# Patient Record
Sex: Female | Born: 1970 | Race: Asian | Hispanic: No | Marital: Married | Smoking: Former smoker
Health system: Southern US, Community
[De-identification: ages and names within clinical notes are randomized; demographics above are authoritative.]

## PROBLEM LIST (undated history)

## (undated) DIAGNOSIS — F418 Other specified anxiety disorders: Secondary | ICD-10-CM

## (undated) HISTORY — PX: CARPAL TUNNEL RELEASE: SHX101

---

## 1999-12-12 ENCOUNTER — Inpatient Hospital Stay (HOSPITAL_COMMUNITY): Admission: EM | Admit: 1999-12-12 | Discharge: 1999-12-16 | Payer: Self-pay | Admitting: *Deleted

## 2012-10-15 ENCOUNTER — Emergency Department
Admission: EM | Admit: 2012-10-15 | Discharge: 2012-10-15 | Disposition: A | Discharge status: Home / Self Care | Payer: BC Managed Care – PPO | Source: Home / Self Care | Attending: Family Medicine | Admitting: Family Medicine

## 2012-10-15 ENCOUNTER — Encounter: Payer: Self-pay | Admitting: *Deleted

## 2012-10-15 DIAGNOSIS — J020 Streptococcal pharyngitis: Secondary | ICD-10-CM

## 2012-10-15 DIAGNOSIS — H6011 Cellulitis of right external ear: Secondary | ICD-10-CM

## 2012-10-15 DIAGNOSIS — H60399 Other infective otitis externa, unspecified ear: Secondary | ICD-10-CM

## 2012-10-15 HISTORY — DX: Other specified anxiety disorders: F41.8

## 2012-10-15 MED ORDER — PENICILLIN V POTASSIUM 500 MG PO TABS: ORAL_TABLET | ORAL | Status: DC

## 2012-10-15 NOTE — ED Provider Notes (Signed)
History     CSN: [id number]  Arrival date & time 10/15/12  0827   First MD Initiated Contact with Patient 10/15/12 [phone]      Chief Complaint  Patient presents with  . Sore Throat  . Fever      HPI Comments: Patient presents with two problems: 1)  She developed a sore throat, fever, fatigue, myalgias, and chills/sweats yesterday. 2)  She developed pain, swelling, and purulent drainage from a piercing in her right earlobe 3 days ago.  The history is provided by the patient.    Past Medical History  Diagnosis Date  . Depression with anxiety     Past Surgical History  Procedure Laterality Date  . Carpal tunnel release      Family History  Problem Relation Age of Onset  . Diabetes Father     History  Substance Use Topics  . Smoking status: Former Smoker    Types: Cigarettes  . Smokeless tobacco: Never Used  . Alcohol Use: Yes    OB History   Grav Para Term Preterm Abortions TAB SAB Ect Mult Living                  Review of Systems + sore throat No cough No pleuritic pain No wheezing No nasal congestion ? post-nasal drainage No sinus pain/pressure No itchy/red eyes No earache, but has pain/swelling right earlobe No hemoptysis No SOB No fever, + chills No nausea No vomiting No abdominal pain No diarrhea No urinary symptoms No skin rashes + fatigue + myalgias No headache    Allergies  Review of patient's allergies indicates no known allergies.  Home Medications   Current Outpatient Rx  Name  Route  Sig  Dispense  Refill  . Multiple Vitamin (MULTIVITAMIN) tablet   Oral   Take 1 tablet by mouth daily.         Marland Kitchen venlafaxine XR (EFFEXOR-XR) 150 MG 24 hr capsule   Oral   Take 150 mg by mouth daily.         . vitamin B-12 (CYANOCOBALAMIN) 100 MCG tablet   Oral   Take 50 mcg by mouth daily.         . penicillin v potassium (VEETID) 500 MG tablet      Take one tab by mouth twice daily for 10 days   20 tablet   0     BP 128/89   Pulse 100  Temp(Src) 101.2 F (38.4 C) (Oral)  Resp 14  Ht 5\' 8"  (1.727 m)  Wt 138 lb (62.596 kg)  BMI 20.99 kg/m2  SpO2 99%  LMP 10/12/2012  Physical Exam Nursing notes and Vital Signs reviewed. Appearance:  Patient appears healthy, stated age, and in no acute distress Eyes:  Pupils are equal, round, and reactive to light and accomodation.  Extraocular movement is intact.  Conjunctivae are not inflamed  Ears:  Canals normal.  Tympanic membranes normal.  Right earlobe is erythematous/tender at site of piercing with crusted discharge present. Nose:  Mildly congested turbinates.  No sinus tenderness.   Pharynx:  Erythematous and slightly swollen without obstruction.  No exudate Neck:  Supple.   Tender enlarged anterior nodes are palpated bilaterally  Lungs:  Clear to auscultation.  Breath sounds are equal.  Heart:  Regular rate and rhythm without murmurs, rubs, or gallops.  Abdomen:  Nontender without masses or hepatosplenomegaly.  Bowel sounds are present.  No CVA or flank tenderness.  Extremities:  No edema.  No calf tenderness  Skin:  No rash present.   ED Course  Procedures  none  Labs Reviewed  POCT RAPID STREP A (OFFICE) - Abnormal; Notable for the following:    Rapid Strep A Screen Positive (*)       WOUND CULTURE pending      1. Streptococcal sore throat   2. Cellulitis of right external ear       MDM  Wound culture taken right ear lobe. Begin pen VK. May take Ibuprofen 200mg , 4 tabs every 8 hours with food.  Try warm salt water gargles.  Apply warm compress to right ear lobe several times daily. Followup with Family Doctor if not improved in one week.         Lattie Haw, MD 10/15/12 [phone]

## 2012-10-15 NOTE — ED Notes (Signed)
Kayla James c/o sore throat and fever and body aches x yesterday.

## 2012-10-17 ENCOUNTER — Telehealth: Payer: Self-pay

## 2012-10-17 LAB — WOUND CULTURE: Gram Stain: NONE SEEN

## 2012-10-17 NOTE — ED Notes (Signed)
Left a message on voice mail asking how patient is feeling and advising to call back with any questions or concerns.  

## 2015-02-24 ENCOUNTER — Emergency Department (INDEPENDENT_AMBULATORY_CARE_PROVIDER_SITE_OTHER): Payer: 59

## 2015-02-24 ENCOUNTER — Encounter: Payer: Self-pay | Admitting: *Deleted

## 2015-02-24 ENCOUNTER — Emergency Department
Admission: EM | Admit: 2015-02-24 | Discharge: 2015-02-24 | Disposition: A | Discharge status: Home / Self Care | Payer: 59 | Source: Home / Self Care | Attending: Family Medicine | Admitting: Family Medicine

## 2015-02-24 DIAGNOSIS — S338XXA Sprain of other parts of lumbar spine and pelvis, initial encounter: Secondary | ICD-10-CM | POA: Diagnosis not present

## 2015-02-24 DIAGNOSIS — M533 Sacrococcygeal disorders, not elsewhere classified: Secondary | ICD-10-CM | POA: Diagnosis not present

## 2015-02-24 MED ORDER — MELOXICAM 15 MG PO TABS: 15.0000 mg | ORAL_TABLET | Freq: Every day | ORAL | Status: AC

## 2015-02-24 MED ORDER — CYCLOBENZAPRINE HCL 10 MG PO TABS: ORAL_TABLET | ORAL | Status: AC

## 2015-02-24 NOTE — ED Provider Notes (Signed)
CSN: [id number]     Arrival date & time 02/24/15  4098 History   First MD Initiated Contact with Patient 02/24/15 1044     Chief Complaint  Patient presents with  . Tailbone Pain      HPI Comments: While in her bathroom last night, patient lost her balance and fell backwards, striking her coccyx on a windowsill.  She has had persistent pain, worse when arising from a chair and walking.  Patient is a 44 y.o. female presenting with back pain. The history is provided by the patient.  Back Pain Pain location: coccyx. Quality:  Aching Radiates to:  Does not radiate Pain severity:  Moderate Pain is:  Same all the time Onset quality:  Sudden Duration:  1 day Timing:  Constant Progression:  Unchanged Chronicity:  New Context: falling   Relieved by:  Nothing Worsened by:  Movement Ineffective treatments:  Cold packs and heating pad Associated symptoms: no abdominal pain, no bladder incontinence, no bowel incontinence, no leg pain, no numbness, no paresthesias, no pelvic pain, no perianal numbness and no tingling     Past Medical History  Diagnosis Date  . Depression with anxiety    Past Surgical History  Procedure Laterality Date  . Carpal tunnel release     Family History  Problem Relation Age of Onset  . Diabetes Father    Social History  Substance Use Topics  . Smoking status: Former Smoker    Types: Cigarettes  . Smokeless tobacco: Never Used  . Alcohol Use: Yes   OB History    No data available     Review of Systems  Gastrointestinal: Negative for abdominal pain and bowel incontinence.  Genitourinary: Negative for bladder incontinence and pelvic pain.  Musculoskeletal: Positive for back pain.  Neurological: Negative for tingling, numbness and paresthesias.  All other systems reviewed and are negative.   Allergies  Review of patient's allergies indicates no known allergies.  Home Medications   Prior to Admission medications   Medication Sig Start Date End  Date Taking? Authorizing Provider  cyclobenzaprine (FLEXERIL) 10 MG tablet Take one tab by mouth at bedtime for muscle spasm 02/24/15   Lattie Haw, MD  meloxicam (MOBIC) 15 MG tablet Take 1 tablet (15 mg total) by mouth daily. Take with food each morning 02/24/15   Lattie Haw, MD  Multiple Vitamin (MULTIVITAMIN) tablet Take 1 tablet by mouth daily.    Historical Provider, MD  venlafaxine XR (EFFEXOR-XR) 150 MG 24 hr capsule Take 150 mg by mouth daily.    Historical Provider, MD  vitamin B-12 (CYANOCOBALAMIN) 100 MCG tablet Take 50 mcg by mouth daily.    Historical Provider, MD   Meds Ordered and Administered this Visit  Medications - No data to display  BP 121/76 mmHg  Pulse 70  Temp(Src) 98.6 F (37 C) (Oral)  Ht  (1.727 m)  Wt 131 lb 8 oz (59.648 kg)  BMI 20.00 kg/m2  SpO2 100%  LMP 02/22/2015 No data found.   Physical Exam  Constitutional: She is oriented to person, place, and time. She appears well-developed and well-nourished. No distress.  HENT:  Head: Atraumatic.  Mouth/Throat: Oropharynx is clear and moist.  Eyes: Conjunctivae are normal. Pupils are equal, round, and reactive to light.  Neck: Normal range of motion.  Cardiovascular: Normal heart sounds.   Pulmonary/Chest: Breath sounds normal.  Abdominal: There is no tenderness.  Musculoskeletal:       Back:  There is distinct point tenderness  to palpation over the superior aspect of coccyx as noted on diagram. Back has good range of motion.  Can heel/toe walk and squat without difficulty.  Straight leg raising test is negative.  Sitting knee extension test is negative.  Strength and sensation in the lower extremities is normal.  Patellar and achilles reflexes are normal     Neurological: She is alert and oriented to person, place, and time.  Skin: Skin is warm and dry.  Nursing note and vitals reviewed.   ED Course  Procedures  None   Imaging Review Dg Sacrum/coccyx  02/24/2015   CLINICAL DATA:   Fall.  Coccygeal pain  EXAM: SACRUM AND COCCYX - 2+ VIEW  COMPARISON:  None.  FINDINGS: There is no evidence of fracture or other focal bone lesions.  IMPRESSION: Negative.   Electronically Signed   By: Marlan Palau M.D.   On: 02/24/2015 10:42     MDM   1. Coccyx sprain, initial encounter    Rx for Mobic  QAM, and Flexeril  HS Apply ice pack for 20 to 30 minutes, 3 to 4 times daily  Continue until pain decreases.  Obtain a "doughnut" pad for sitting. Followup with Dr. Rodney Langton or Dr. Clementeen [facility] ([facility]) if not improving about two weeks.     Lattie Haw, MD 02/25/15 [phone]

## 2015-02-24 NOTE — ED Notes (Signed)
Pt was startled by her child last night while in the bathroom and she fell onto the windowsill righ ton her tailbone.  Pain is 8/10 and worse when moving.  She has trouble rising from a sitting position.  Has tried ice and heat with little relief.

## 2015-02-24 NOTE — Discharge Instructions (Signed)
Apply ice pack for 20 to 30 minutes, 3 to 4 times daily  Continue until pain decreases.  Obtain a "doughnut" pad for sitting.   Tailbone Injury The tailbone (coccyx) is the small bone at the lower end of the spine. A tailbone injury may involve stretched ligaments, bruising, or a broken bone (fracture). Women are more vulnerable to this injury due to having a wider pelvis. CAUSES  This type of injury typically occurs from falling and landing on the tailbone. Repeated strain or friction from actions such as rowing and bicycling may also injure the area. The tailbone can be injured during childbirth. Infections or tumors may also press on the tailbone and cause pain. Sometimes, the cause of injury is unknown. SYMPTOMS   Bruising.  Pain when sitting.  Painful bowel movements.  In women, pain during intercourse. DIAGNOSIS  Your caregiver can diagnose a tailbone injury based on your symptoms and a physical exam. X-rays may be taken if a fracture is suspected. Your caregiver may also use an MRI scan imaging test to evaluate your symptoms. TREATMENT  Your caregiver may prescribe medicines to help relieve your pain. Most tailbone injuries heal on their own in 4 to 6 weeks. However, if the injury is caused by an infection or tumor, the recovery period may vary. PREVENTION  Wear appropriate padding and sports gear when bicycling and rowing. This can help prevent an injury from repeated strain or friction. HOME CARE INSTRUCTIONS   Put ice on the injured area.  Put ice in a plastic bag.  Place a towel between your skin and the bag.  Leave the ice on for 15-20 minutes, every hour while awake for the first 1 to 2 days.  Sit on a large, rubber or inflated ring or cushion to ease your pain. Lean forward when sitting to help decrease discomfort.  Avoid sitting for long periods of time.  Increase your activity as the pain allows.  Only take over-the-counter or prescription medicines for pain,  discomfort, or fever as directed by your caregiver.  You may use stool softeners if it is painful to have a bowel movement, or as directed by your caregiver.  Eat a diet with plenty of fiber to help prevent constipation.  Keep all follow-up appointments as directed by your caregiver. SEEK MEDICAL CARE IF:   Your pain becomes worse.  Your bowel movements cause a great deal of discomfort.  You are unable to have a bowel movement.  You have a fever. MAKE SURE YOU:  Understand these instructions.  Will watch your condition.  Will get help right away if you are not doing well or get worse. Document Released: 05/09/2000 Document Revised: 08/04/2011 Document Reviewed: 12/05/2010 [facility] Patient Information 2015 [place], [place]. This information is not intended to replace advice given to you by your health care provider. Make sure you discuss any questions you have with your health care provider.

## 2016-12-02 IMAGING — CR DG SACRUM/COCCYX 2+V
3 series · 3 of 3 positions shown · non-contrast
Comparison: None.

CLINICAL DATA: Fall.  Coccygeal pain

EXAM:
SACRUM AND COCCYX - 2+ VIEW

[coccyx ap]
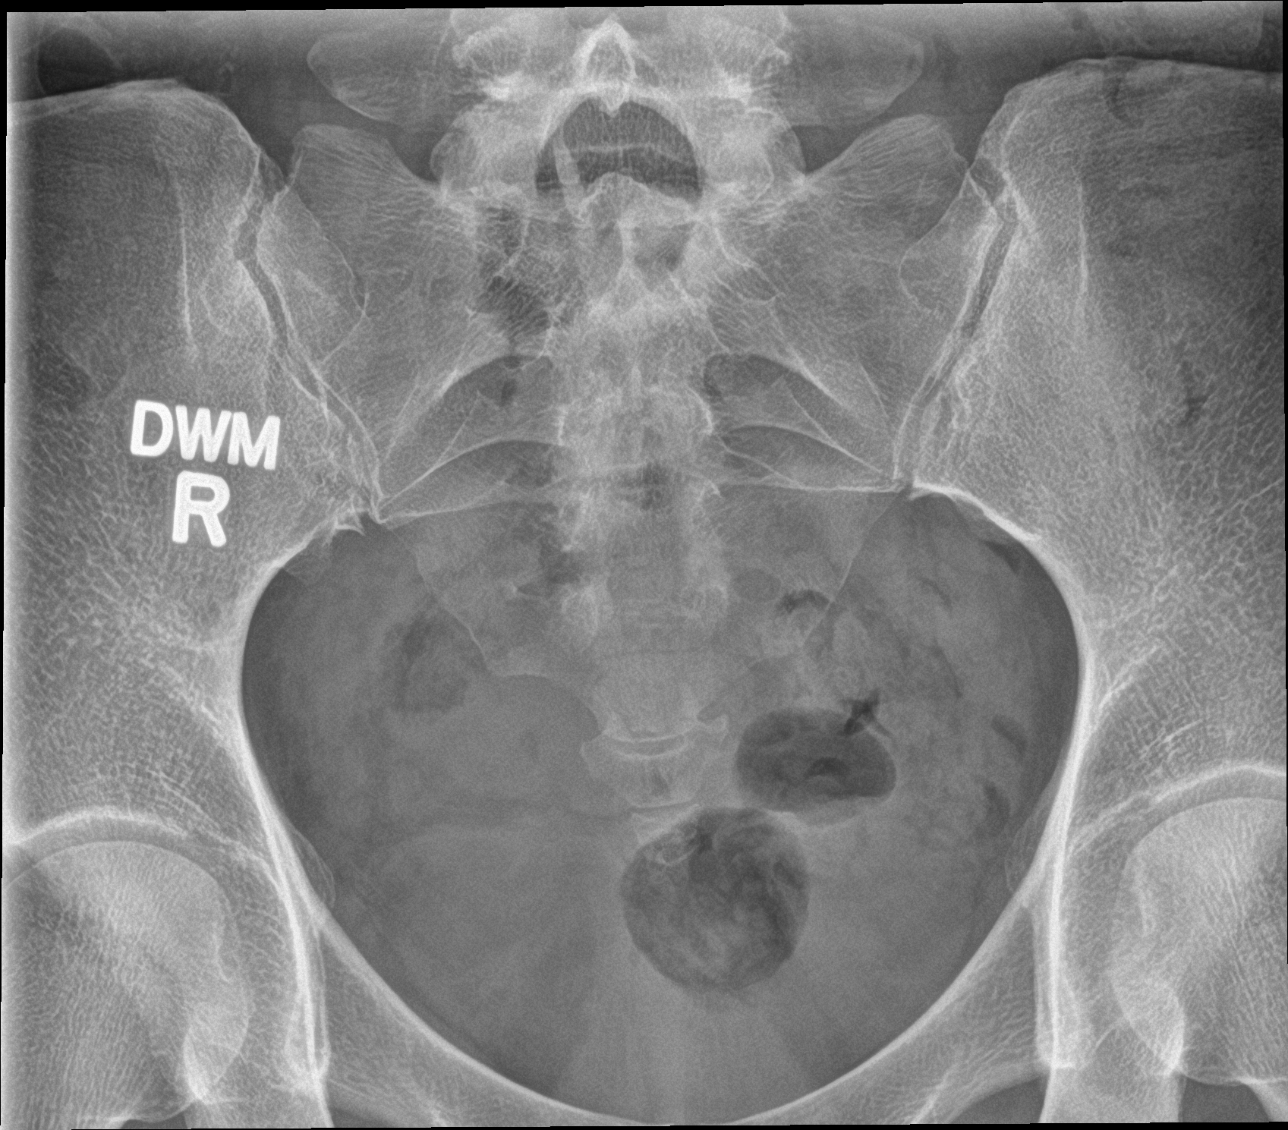

[sacrum ap]
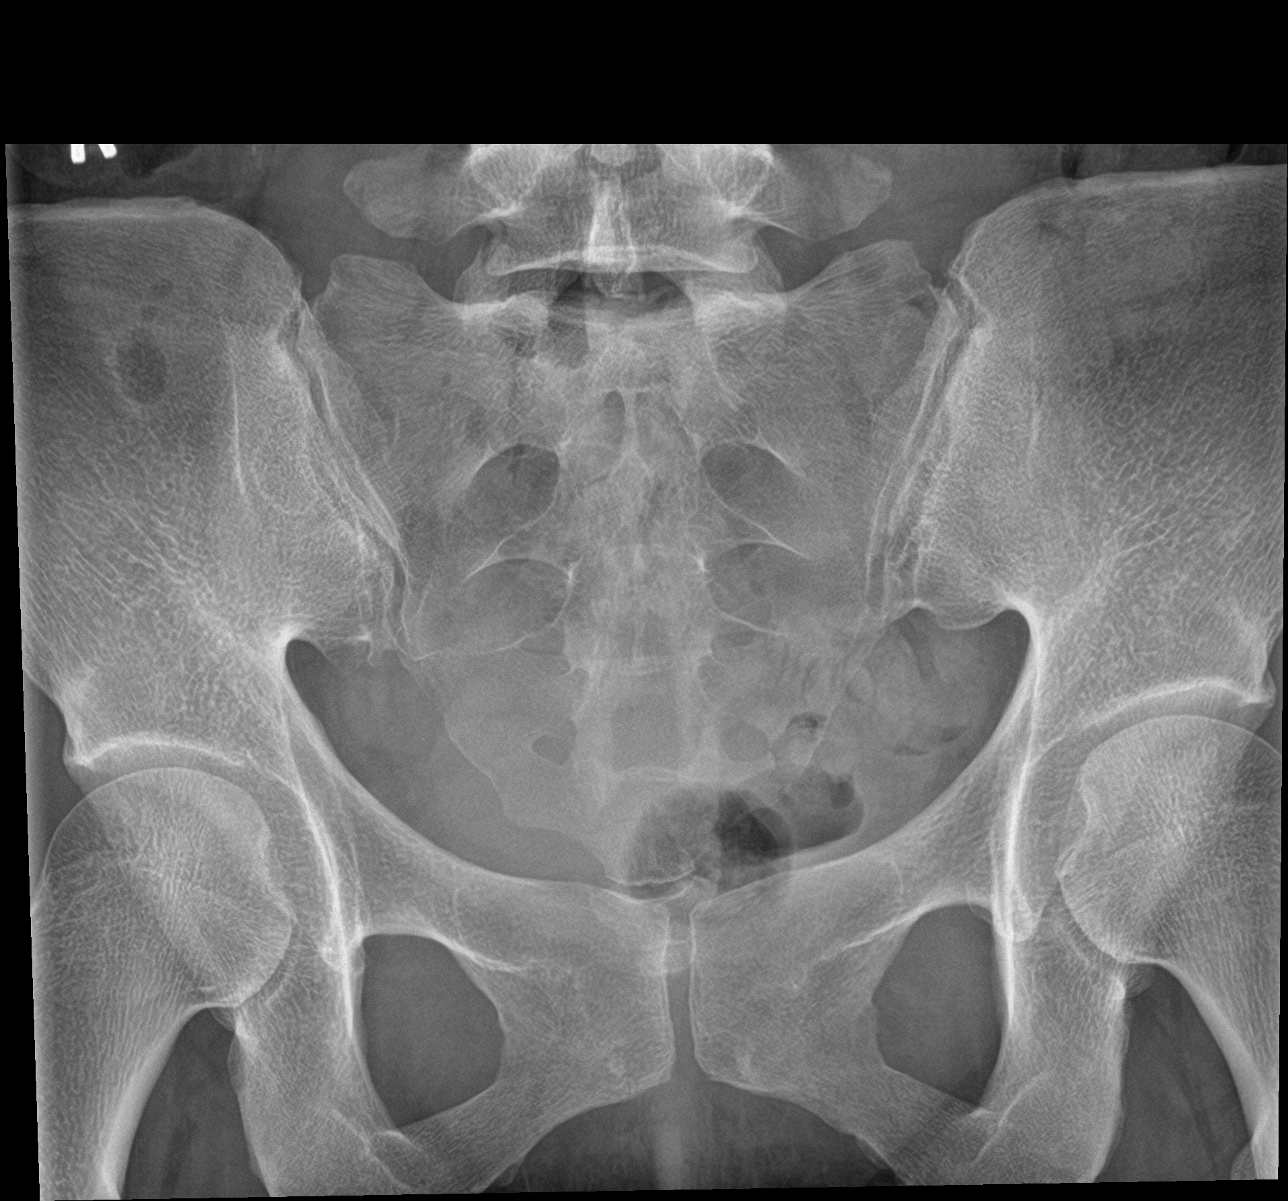

[sacrum lat]
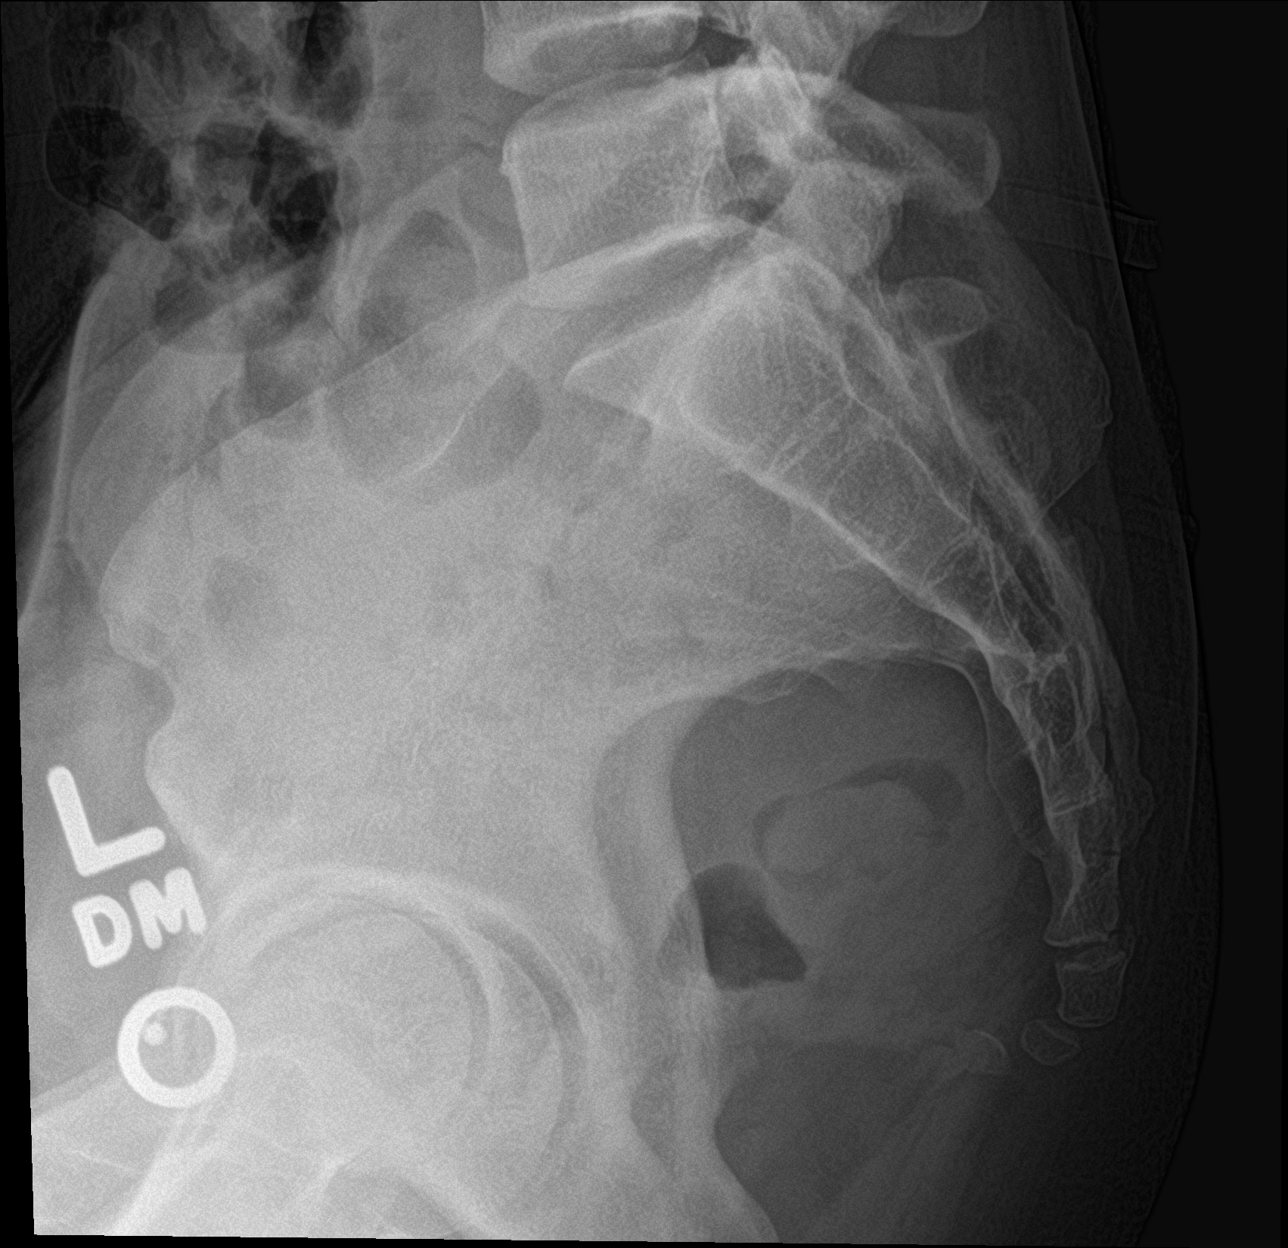

[3 of 3 positions shown; findings below may reference images not displayed]

FINDINGS: There is no evidence of fracture or other focal bone lesions.
IMPRESSION: Negative.
